# Patient Record
Sex: Female | Born: 1985 | Race: Black or African American | Hispanic: No | Marital: Single
Health system: Southern US, Community
[De-identification: ages and names within clinical notes are randomized; demographics above are authoritative.]

## PROBLEM LIST (undated history)

## (undated) DIAGNOSIS — J45909 Unspecified asthma, uncomplicated: Secondary | ICD-10-CM

---

## 2015-01-02 ENCOUNTER — Emergency Department (HOSPITAL_BASED_OUTPATIENT_CLINIC_OR_DEPARTMENT_OTHER)
Admission: EM | Admit: 2015-01-02 | Discharge: 2015-01-02 | Disposition: A | Discharge status: Home / Self Care | Payer: 59 | Attending: Emergency Medicine | Admitting: Emergency Medicine

## 2015-01-02 ENCOUNTER — Encounter (HOSPITAL_BASED_OUTPATIENT_CLINIC_OR_DEPARTMENT_OTHER): Payer: Self-pay

## 2015-01-02 DIAGNOSIS — Y9389 Activity, other specified: Secondary | ICD-10-CM | POA: Insufficient documentation

## 2015-01-02 DIAGNOSIS — S3992XA Unspecified injury of lower back, initial encounter: Secondary | ICD-10-CM | POA: Diagnosis present

## 2015-01-02 DIAGNOSIS — J45909 Unspecified asthma, uncomplicated: Secondary | ICD-10-CM | POA: Insufficient documentation

## 2015-01-02 DIAGNOSIS — X500XXA Overexertion from strenuous movement or load, initial encounter: Secondary | ICD-10-CM | POA: Insufficient documentation

## 2015-01-02 DIAGNOSIS — S29019A Strain of muscle and tendon of unspecified wall of thorax, initial encounter: Secondary | ICD-10-CM

## 2015-01-02 DIAGNOSIS — Y9289 Other specified places as the place of occurrence of the external cause: Secondary | ICD-10-CM | POA: Insufficient documentation

## 2015-01-02 DIAGNOSIS — S29012A Strain of muscle and tendon of back wall of thorax, initial encounter: Secondary | ICD-10-CM | POA: Insufficient documentation

## 2015-01-02 DIAGNOSIS — Y998 Other external cause status: Secondary | ICD-10-CM | POA: Insufficient documentation

## 2015-01-02 HISTORY — DX: Unspecified asthma, uncomplicated: J45.909

## 2015-01-02 MED ORDER — CYCLOBENZAPRINE HCL 10 MG PO TABS: 10.0000 mg | ORAL_TABLET | Freq: Three times a day (TID) | ORAL | Status: AC | PRN

## 2015-01-02 MED ORDER — HYDROCODONE-ACETAMINOPHEN 5-325 MG PO TABS: 1.0000 | ORAL_TABLET | Freq: Four times a day (QID) | ORAL | Status: AC | PRN

## 2015-01-02 NOTE — ED Notes (Signed)
Pt has had chronic lower back pain but today twisted and felt her middle back lock up on her.

## 2015-01-02 NOTE — ED Provider Notes (Signed)
CSN: [id number]     Arrival date & time 01/02/15  2109 History  By signing my name below, I, Carrie Dougherty, attest that this documentation has been prepared under the direction and in the presence of Paula Libra, MD. Electronically Signed: Gonzella Dougherty, Scribe. 01/02/2015. 11:27 PM.    Chief Complaint  Patient presents with  . Back Pain    The history is provided by the patient. No language interpreter was used.    HPI Comments: Carrie Dougherty is a 29 y.o. female who presents to the Emergency Department complaining of lower back pain for the past several months which has not been severe for which she has taken no medications.. Pt reports that today she turned while carrying a bag on her shoulder and experienced a severe, sharp pain in her left mid back. She notes that she did not hear or feel a pop when this occurred. It now hurts worse with movement and looking downward. Pt states that her pain has improved since the incident but is still moderate. She denies taking medication PTA but she laid on floor until she was able to sit up and reports that she then took a hot shower. She is having no numbness or weakness associated with this.  Past Medical History  Diagnosis Date  . Asthma    History reviewed. No pertinent past surgical history. No family history on file. Social History  Substance Use Topics  . Smoking status: None  . Smokeless tobacco: None  . Alcohol Use: None   OB History    No data available     Review of Systems A complete 10 system review of systems was obtained and all systems are negative except as noted in the HPI and PMH.   Allergies  Review of patient's allergies indicates no known allergies.  Home Medications   Prior to Admission medications   Medication Sig Start Date End Date Taking? Authorizing Provider  cyclobenzaprine (FLEXERIL) 10 MG tablet Take 1 tablet (10 mg total) by mouth 3 (three) times daily as needed for muscle spasms.  01/02/15   Isauro Skelley, MD  HYDROcodone-acetaminophen (NORCO) 5-325 MG tablet Take 1-2 tablets by mouth every 6 (six) hours as needed (for pain). 01/02/15   Vaunda Gutterman, MD   BP 121/66 mmHg  Pulse 88  Temp(Src) 99.3 F (37.4 C) (Oral)  Resp 17  Ht  (1.676 m)  Wt 275 lb (124.739 kg)  BMI 44.41 kg/m2  SpO2 100%  LMP 12/12/2014   Physical Exam General: Well-developed, well-nourished female in no acute distress; appearance consistent with age of record HENT: normocephalic; atraumatic Eyes: pupils equal, round and reactive to light; extraocular muscles intact Neck: supple; anterior flexion reproduces back pain  Back: Soft tissue tenderness of the left parathoracic region  Heart: regular rate and rhythm Lungs: clear to auscultation bilaterally Abdomen: soft; nondistended; nontender; bowel sounds present Extremities: No deformity; full range of motion; pulses normal Neurologic: Awake, alert and oriented; motor function intact in all extremities and symmetric; no facial droop Skin: Warm and dry Psychiatric: Normal mood and affect  ED Course  Procedures  DIAGNOSTIC STUDIES:    Oxygen Saturation is 100% on RA, normal by my interpretation.   COORDINATION OF CARE:  11:18 PM Will discharge pt with hydrocodone and Flexeril 10 mg tablet. Discussed treatment plan with pt at bedside and pt agreed to plan.   MDM   Final diagnoses:  Acute thoracic myofascial strain, initial encounter   I personally performed the  services described in this documentation, which was scribed in my presence. The recorded information has been reviewed and is accurate.    Paula LibraJohn Kariya Lavergne, MD 01/02/15 [phone]

## 2015-01-02 NOTE — Discharge Instructions (Signed)
Thoracic Strain A thoracic strain, which is sometimes called a mid-back strain, is an injury to the muscles or tendons that attach to the upper part of your back behind your chest. This type of injury occurs when a muscle is overstretched or overloaded.  Thoracic strains can range from mild to severe. Mild strains may involve stretching a muscle or tendon without tearing it. These injuries may heal in 1-2 weeks. More severe strains involve tearing of muscle fibers or tendons. These will cause more pain and may take 6-8 weeks to heal. CAUSES This condition may be caused by:  An injury in which a sudden force is placed on the muscle.  Exercising without properly warming up.  Overuse of the muscle.  Improper form during certain movements.  Other injuries that surround or cause stress on the mid-back, causing a strain on the muscles. In some cases, the cause may not be known. RISK FACTORS This injury is more common in:  Athletes.  People with obesity. SYMPTOMS The main symptom of this condition is pain, especially with movement. Other symptoms include:  Bruising.  Swelling.  Spasm. DIAGNOSIS This condition may be diagnosed with a physical exam. X-rays may be taken to check for a fracture. TREATMENT This condition may be treated with:  Resting and icing the injured area.  Physical therapy. This will involve doing stretching and strengthening exercises.  Medicines for pain and inflammation. HOME CARE INSTRUCTIONS  Rest as needed. Follow instructions from your health care provider about any restrictions on activity.  If directed, apply ice to the injured area:  Put ice in a plastic bag.  Place a towel between your skin and the bag.  Leave the ice on for 20 minutes, 2-3 times per day.  Take over-the-counter and prescription medicines only as told by your health care provider.  Begin doing exercises as told by your health care provider or physical therapist.  Always  warm up properly before physical activity or sports.  Bend your knees before you lift heavy objects.  Keep all follow-up visits as told by your health care provider. This is important. SEEK MEDICAL CARE IF:  Your pain is not helped by medicine.  Your pain, bruising, or swelling is getting worse.  You have a fever. SEEK IMMEDIATE MEDICAL CARE IF:  You have shortness of breath.  You have chest pain.  You develop numbness or weakness in your legs.  You have involuntary loss of urine (urinary incontinence).   This information is not intended to replace advice given to you by your health care provider. Make sure you discuss any questions you have with your health care provider.   Document Released: 03/20/2003 Document Revised: 09/18/2014 Document Reviewed: 02/21/2014 Elsevier Interactive Patient Education 2016 [employer]  

## 2018-08-29 ENCOUNTER — Emergency Department (HOSPITAL_BASED_OUTPATIENT_CLINIC_OR_DEPARTMENT_OTHER): Payer: Medicaid Other

## 2018-08-29 ENCOUNTER — Encounter (HOSPITAL_BASED_OUTPATIENT_CLINIC_OR_DEPARTMENT_OTHER): Payer: Self-pay | Admitting: *Deleted

## 2018-08-29 ENCOUNTER — Other Ambulatory Visit: Payer: Self-pay

## 2018-08-29 ENCOUNTER — Emergency Department (HOSPITAL_BASED_OUTPATIENT_CLINIC_OR_DEPARTMENT_OTHER)
Admission: EM | Admit: 2018-08-29 | Discharge: 2018-08-29 | Disposition: A | Discharge status: Home / Self Care | Payer: Medicaid Other | Attending: Emergency Medicine | Admitting: Emergency Medicine

## 2018-08-29 DIAGNOSIS — M79672 Pain in left foot: Secondary | ICD-10-CM

## 2018-08-29 DIAGNOSIS — J45909 Unspecified asthma, uncomplicated: Secondary | ICD-10-CM | POA: Insufficient documentation

## 2018-08-29 MED ORDER — DICLOFENAC SODIUM 1 % TD GEL: 4.0000 g | Freq: Four times a day (QID) | TRANSDERMAL | 1 refills | Status: AC

## 2018-08-29 NOTE — Discharge Instructions (Signed)
You have been seen today for foot pain. There were no acute abnormalities on the x-rays, including no sign of fracture or dislocation, however, there could be injuries to the soft tissues, such as the ligaments or tendons that are not seen on xrays. There could also be what are called occult fractures that are small fractures not seen on xray. Antiinflammatory medications: Take 600 mg of ibuprofen every 6 hours or 440 mg (over the counter dose) to 500 mg (prescription dose) of naproxen every 12 hours for the next 3 days. After this time, these medications may be used as needed for pain. Take these medications with food to avoid upset stomach. Choose only one of these medications, do not take them together. Acetaminophen (generic for Tylenol): Should you continue to have additional pain while taking the ibuprofen or naproxen, you may add in acetaminophen as needed. Your daily total maximum amount of acetaminophen from all sources should be limited to 4000mg /day for persons without liver problems, or 2000mg /day for those with liver problems. Diclofenac gel: This is a topical anti-inflammatory medication and can be applied directly to the painful region.  Do not use on the face or genitals.  This medication may be used as an alternative to oral anti-inflammatory medications, such as ibuprofen or naproxen. Ice: May apply ice to the area over the next 24 hours for 15 minutes at a time to reduce swelling. Elevation: Keep the extremity elevated as often as possible to reduce pain and inflammation. Follow up: If symptoms are improving, you may follow up with your primary care provider for any continued management. If symptoms are not starting to improve within a week, you should follow up with the orthopedic specialist within two weeks. Return: Return to the ED for numbness, weakness, increasing pain, overall worsening symptoms, loss of function, or if symptoms are not improving, you have tried to follow up with the  orthopedic specialist, and have been unable to do so.  For prescription assistance, may try using prescription discount sites or apps, such as goodrx.com

## 2018-08-29 NOTE — ED Provider Notes (Signed)
[facility] EM[facility] DEPART[facility] Provider Note   CSN: [id number] Arrival date & time: 08/29/18  1754    History   Chief Complaint Chief Complaint  Patient presents with  . Foot Pain    HPI Carrie Dougherty is a 33 y.o. female.     HPI   Carrie Dougherty is a 33 y.o. female, with a history of asthma, presenting to the ED with left foot pain beginning around August 7.   She states she had bilateral foot swelling beginning around August 6 that she thinks was due to travel and extended time on her feet.  The next day on August 7, she hit the top of her foot climbing into bed. Her bilateral foot swelling has overall improved and has resolved in the right foot.  She notes continued mild swelling to the top of the left foot as well as pain and tenderness, moderate, nonradiating.  Denies numbness, weakness, fever, ankle pain/injury, changes in urination, shortness of breath, chest pain, or any other complaints.     Past Medical History:  Diagnosis Date  . Asthma     There are no active problems to display for this patient.   History reviewed. No pertinent surgical history.   OB History   No obstetric history on file.      Home Medications    Prior to Admission medications   Medication Sig Start Date End Date Taking? Authorizing Provider  cyclobenzaprine (FLEXERIL) 10 MG tablet Take 1 tablet (10 mg total) by mouth 3 (three) times daily as needed for muscle spasms. 01/02/15   Molpus, John, MD  diclofenac sodium (VOLTAREN) 1 % GEL Apply 4 g topically 4 (four) times daily. 08/29/18   ,  C, PA-C  HYDROcodone-acetaminophen (NORCO) 5-325 MG tablet Take 1-2 tablets by mouth every 6 (six) hours as needed (for pain). 01/02/15   Molpus, John, MD    Family History No family history on file.  Social History Social History   Tobacco Use  . Smoking status: Not on file  Substance Use Topics  . Alcohol use: Not on file  . Drug use: Not on file     Allergies    Patient has no known allergies.   Review of Systems Review of Systems  Constitutional: Negative for fever.  Respiratory: Negative for shortness of breath.   Cardiovascular: Negative for chest pain.  Musculoskeletal: Positive for arthralgias and joint swelling.  Neurological: Negative for weakness and numbness.     Physical Exam Updated Vital Signs BP 130/78 (BP Location: Left Arm)   Pulse 88   Temp 99.5 F (37.5 C) (Oral)   Resp 18   Ht 5\' 6"  (1.676 m)   Wt 132 kg   LMP 08/29/2018 (Exact Date)   SpO2 100%   BMI 46.97 kg/m   Physical Exam Vitals signs and nursing note reviewed.  Constitutional:      General: She is not in acute distress.    Appearance: She is well-developed. She is not diaphoretic.  HENT:     Head: Normocephalic and atraumatic.  Eyes:     Conjunctiva/sclera: Conjunctivae normal.  Neck:     Musculoskeletal: Neck supple.  Cardiovascular:     Rate and Rhythm: Normal rate and regular rhythm.  Pulmonary:     Effort: Pulmonary effort is normal.  Musculoskeletal:        General: Tenderness present.     Comments: Tenderness to the dorsal left foot proximal to the MTP joints.  Questionable swelling in this  region.  No erythema, bruising, or noted deformity. Full range of motion without pain in the toes and ankle. No abnormalities noted in the right foot or ankle.  Skin:    General: Skin is warm and dry.     Capillary Refill: Capillary refill takes less than 2 seconds.     Coloration: Skin is not pale.  Neurological:     Mental Status: She is alert.     Comments: Sensation to light touch grossly intact through the left foot and toes. Ambulatory without noted deficit. Strength 5/5 in the left foot and ankle.  Psychiatric:        Behavior: Behavior normal.      ED Treatments / Results  Labs (all labs ordered are listed, but only abnormal results are displayed) Labs Reviewed - No data to display  EKG None  Radiology Dg Foot Complete Left   Result Date: 08/29/2018 CLINICAL DATA:  Dorsal foot pain. Left foot pain and swelling. No known injury. EXAM: LEFT FOOT - COMPLETE 3+ VIEW COMPARISON:  None. FINDINGS: Mild hallux valgus. There is no evidence of fracture or dislocation. Plantar calcaneal spur and Achilles tendon enthesophyte. There is no evidence of arthropathy or other focal bone abnormality. Dorsal soft tissue edema. IMPRESSION: 1. Dorsal soft tissue edema. No acute osseous abnormality. 2. Mild hallux valgus. 3. Plantar calcaneal spur and Achilles tendon enthesophyte. Electronically Signed   By: Narda RutherfordMelanie  Sanford M.D.   On: 08/29/2018 19:40    Procedures Procedures (including critical care time)  Medications Ordered in ED Medications - No data to display   Initial Impression / Assessment and Plan / ED Course  I have reviewed the triage vital signs and the nursing notes.  Pertinent labs & imaging results that were available during my care of the patient were reviewed by me and considered in my medical decision making (see chart for details).        Patient presents with left foot pain.  No evidence of neurovascular compromise. No acute osseous abnormality on x-ray. The patient was given instructions for home care as well as return precautions. Patient voices understanding of these instructions, accepts the plan, and is comfortable with discharge.    Final Clinical Impressions(s) / ED Diagnoses   Final diagnoses:  Foot pain, left    ED Discharge Orders         Ordered    diclofenac sodium (VOLTAREN) 1 % GEL  4 times daily     08/29/18 1947           Concepcion LivingJoy,  C, PA-C 08/29/18 1953    Terrilee FilesButler, Michael C, MD 08/30/18 [phone]

## 2018-08-29 NOTE — ED Triage Notes (Signed)
C/o left foot swelling onset aug 6  Denies inj  ambulatory

## 2020-09-07 IMAGING — DX LEFT FOOT - COMPLETE 3+ VIEW
3 series · 3 of 3 positions shown · non-contrast
Comparison: None.

CLINICAL DATA: Dorsal foot pain. Left foot pain and swelling. No
known injury.

EXAM:
LEFT FOOT - COMPLETE 3+ VIEW

[foot ap]
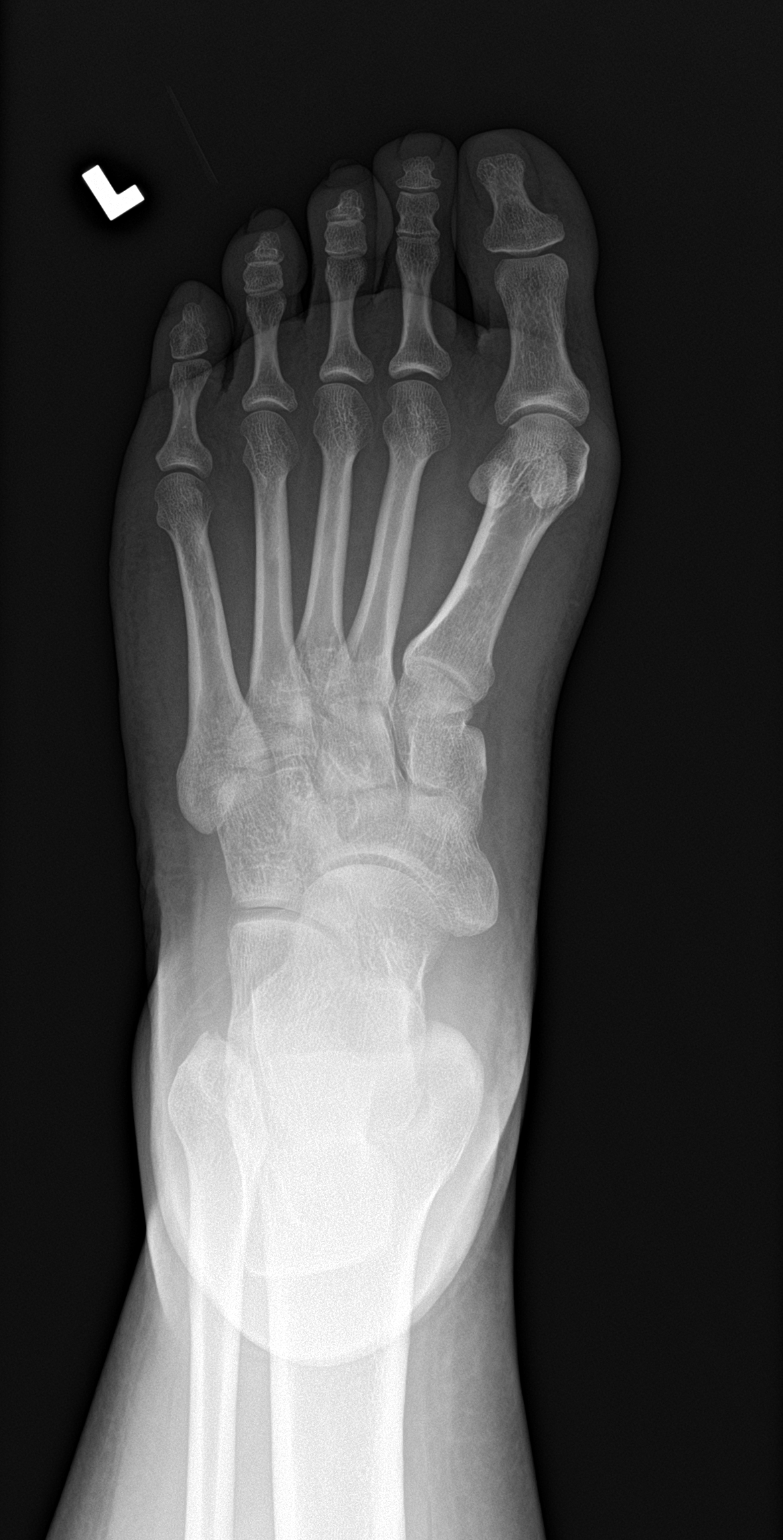

[foot obl]
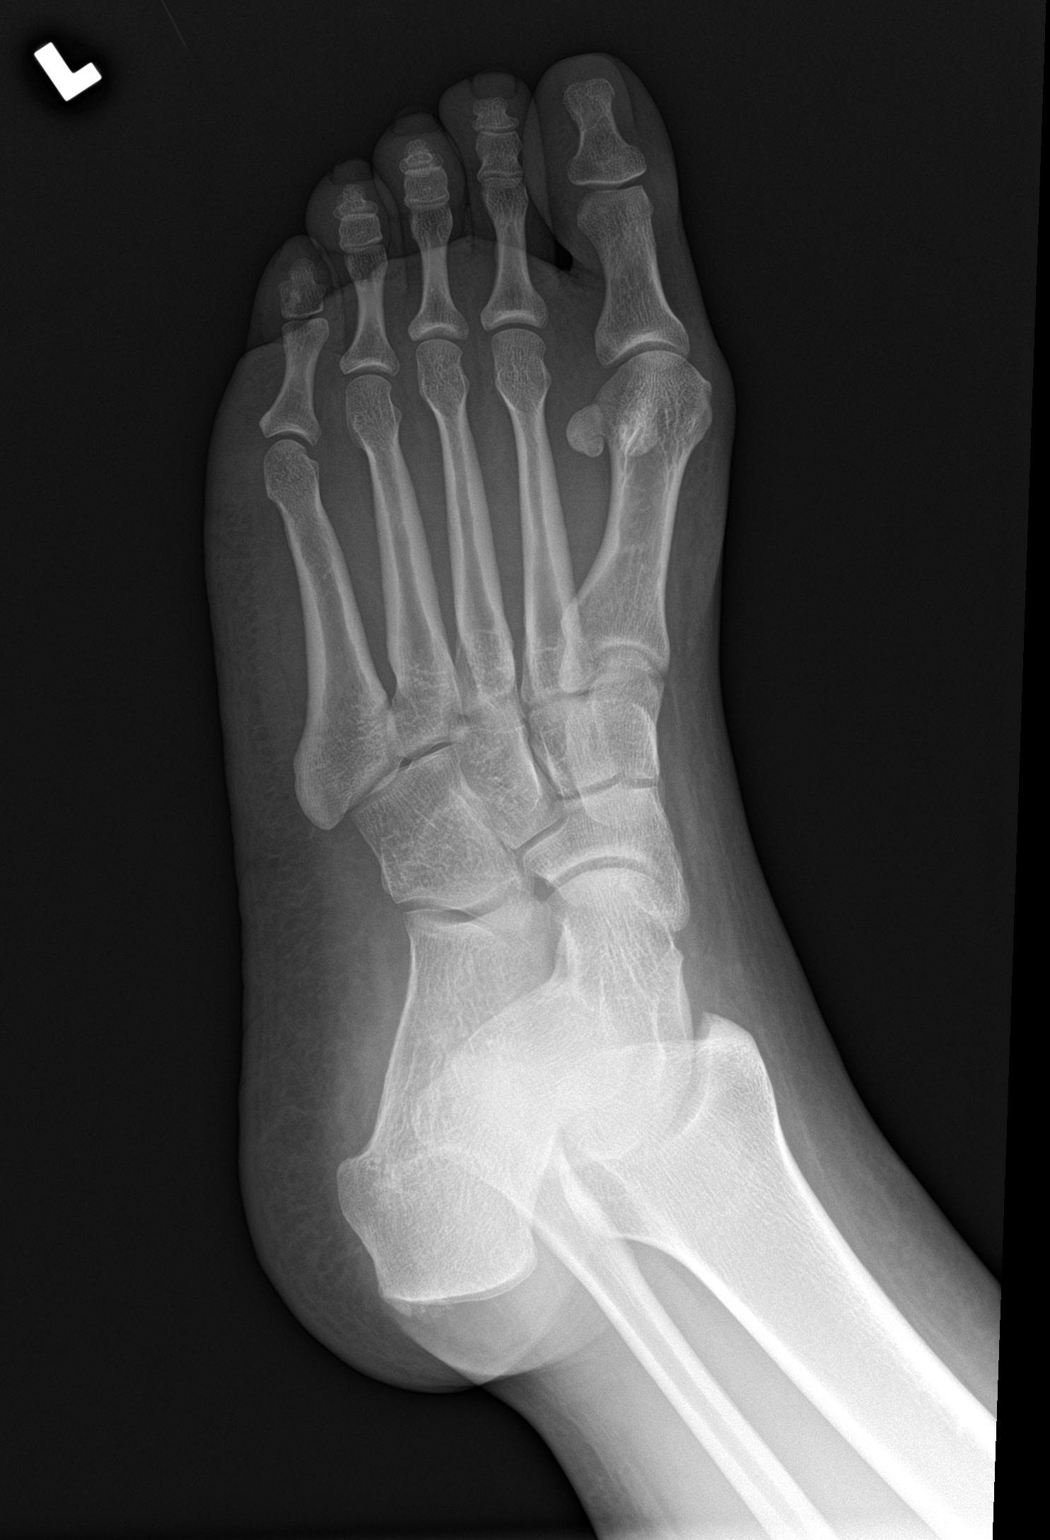

[foot lat]
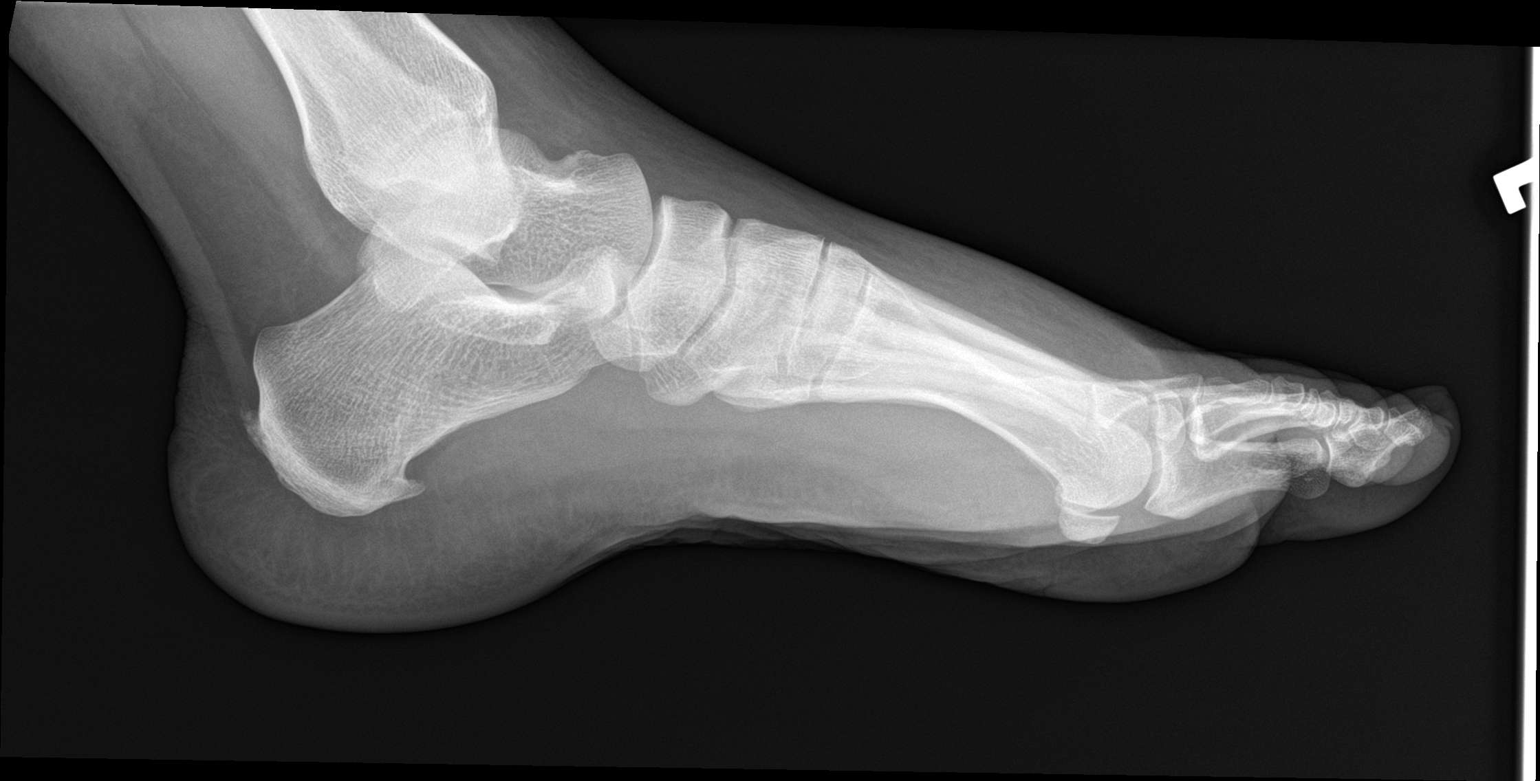

[3 of 3 positions shown; findings below may reference images not displayed]

FINDINGS: Mild hallux valgus. There is no evidence of fracture or dislocation.
Plantar calcaneal spur and Achilles tendon enthesophyte. There is no
evidence of arthropathy or other focal bone abnormality. Dorsal soft
tissue edema.
IMPRESSION: 1. Dorsal soft tissue edema. No acute osseous abnormality.
2. Mild hallux valgus.
3. Plantar calcaneal spur and Achilles tendon enthesophyte.
